# Patient Record
Sex: Male | Born: 1980 | Race: Black or African American | Hispanic: No | Marital: Single | Smoking: Never smoker
Health system: Southern US, Community
[De-identification: ages and names within clinical notes are randomized; demographics above are authoritative.]

## PROBLEM LIST (undated history)

## (undated) DIAGNOSIS — I10 Essential (primary) hypertension: Secondary | ICD-10-CM

---

## 2010-07-22 HISTORY — PX: TENDON REPAIR: SHX5111

## 2020-10-25 NOTE — Progress Notes (Signed)
 [place] Department of Health notified of results per regulations

## 2020-10-25 NOTE — Progress Notes (Signed)
 Patient denies shortness of breath, headache, and chest pain.  States he did not take his medication.  Provider notified and patient is waiting in waiting room.

## 2020-12-15 ENCOUNTER — Other Ambulatory Visit: Payer: Self-pay

## 2020-12-15 ENCOUNTER — Emergency Department (HOSPITAL_COMMUNITY): Payer: Self-pay

## 2020-12-15 ENCOUNTER — Emergency Department (HOSPITAL_COMMUNITY)
Admission: EM | Admit: 2020-12-15 | Discharge: 2020-12-15 | Disposition: A | Discharge status: Home / Self Care | Payer: Self-pay | Attending: Emergency Medicine | Admitting: Emergency Medicine

## 2020-12-15 ENCOUNTER — Encounter (HOSPITAL_COMMUNITY): Payer: Self-pay

## 2020-12-15 DIAGNOSIS — R519 Headache, unspecified: Secondary | ICD-10-CM

## 2020-12-15 DIAGNOSIS — Z79899 Other long term (current) drug therapy: Secondary | ICD-10-CM | POA: Insufficient documentation

## 2020-12-15 DIAGNOSIS — I10 Essential (primary) hypertension: Secondary | ICD-10-CM | POA: Insufficient documentation

## 2020-12-15 HISTORY — DX: Essential (primary) hypertension: I10

## 2020-12-15 MED ORDER — METOCLOPRAMIDE HCL 5 MG/ML IJ SOLN
10.0000 mg | Freq: Once | INTRAMUSCULAR | Status: AC
  Administered 2020-12-15: 10 mg via INTRAVENOUS
  Filled 2020-12-15: qty 2

## 2020-12-15 MED ORDER — LISINOPRIL 20 MG PO TABS
20.0000 mg | ORAL_TABLET | Freq: Once | ORAL | Status: AC
  Administered 2020-12-15: 20 mg via ORAL
  Filled 2020-12-15: qty 1

## 2020-12-15 MED ORDER — LISINOPRIL 20 MG PO TABS: 20.0000 mg | ORAL_TABLET | Freq: Every day | ORAL | 0 refills | Status: DC

## 2020-12-15 MED ORDER — DIPHENHYDRAMINE HCL 50 MG/ML IJ SOLN
25.0000 mg | Freq: Once | INTRAMUSCULAR | Status: AC
  Administered 2020-12-15: 25 mg via INTRAVENOUS
  Filled 2020-12-15: qty 1

## 2020-12-15 NOTE — ED Triage Notes (Signed)
Patient arrives via EMS from home due to a sudden onset of n/v/headache x1 hour. Patient is currently out of his bp med (lisinopril) and had the same symptoms when he ran out of hiss bp meds before.   bp 200/110 en route, decreased to 175/120. Given 4mg  IV zofran

## 2020-12-15 NOTE — Discharge Instructions (Signed)
Please read and follow all provided instructions.  Your diagnoses today include:  1. Acute nonintractable headache, unspecified headache type   2. Primary hypertension     Tests performed today include:  CT of your head which was normal and did not show any serious cause of your headache  Vital signs. See below for your results today.   Medications:  In the Emergency Department you received:  Reglan - antinausea/headache medication  Benadryl - antihistamine to counteract potential side effects of reglan   Lisinopril - medication for blood pressure  Take any prescribed medications only as directed.  Additional information:  Follow any educational materials contained in this packet.  You are having a headache. No specific cause was found today for your headache. It may have been a migraine or other cause of headache. Stress, anxiety, fatigue, and depression are common triggers for headaches.   Your headache today does not appear to be life-threatening or require hospitalization, but often the exact cause of headaches is not determined in the emergency department. Therefore, follow-up with your doctor is very important to find out what may have caused your headache and whether or not you need any further diagnostic testing or treatment.   Sometimes headaches can appear benign (not harmful), but then more serious symptoms can develop which should prompt an immediate re-evaluation by your doctor or the emergency department.  BE VERY CAREFUL not to take multiple medicines containing Tylenol (also called acetaminophen). Doing so can lead to an overdose which can damage your liver and cause liver failure and possibly death.   Follow-up instructions: Please follow-up with your primary care provider in the next 3 days for further evaluation of your symptoms.   Return instructions:   Please return to the Emergency Department if you experience worsening symptoms.  Return if the  medications do not resolve your headache, if it recurs, or if you have multiple episodes of vomiting or cannot keep down fluids.  Return if you have a change from the usual headache.  RETURN IMMEDIATELY IF you:  Develop a sudden, severe headache  Develop confusion or become poorly responsive or faint  Develop a fever above 100.51F or problem breathing  Have a change in speech, vision, swallowing, or understanding  Develop new weakness, numbness, tingling, incoordination in your arms or legs  Have a seizure  Please return if you have any other emergent concerns.  Additional Information:  Your vital signs today were: BP (!) 163/108 (BP Location: Right Arm)   Pulse 67   Temp 97.9 F (36.6 C) (Oral)   Resp 20   Ht 6' (1.829 m)   Wt 115.7 kg   SpO2 99%   BMI 34.58 kg/m  If your blood pressure (BP) was elevated above 135/85 this visit, please have this repeated by your doctor within one month. --------------

## 2020-12-15 NOTE — ED Notes (Signed)
Patient transported to CT 

## 2020-12-15 NOTE — ED Notes (Signed)
Pt states that he has been out of his BP medications x 1 week.

## 2020-12-15 NOTE — ED Provider Notes (Signed)
[facility] EMERGENCY DEPARTMENT Provider Note   CSN: [id number] Arrival date & time: 12/15/20  4401     History Chief Complaint  Patient presents with  . Headache  . Hypertension    Terry Burke is a 40 y.o. male.  Patient with history of hypertension on lisinopril presents to the emergency department for evaluation of headache.  Patient states that sometimes he will develop "migraines" when he does not take his blood pressure medication.  He states that he has been out of lisinopril 20 mg for about a week because of difficulty with getting refills.  He states that he was feeling normal when he went to bed last night but awoke from sleep at around 4:15 AM with a severe frontal headache.  He had associated nausea and vomiting.  Significant other stated that he was "out of it" and had a little difficulty with his balance.  EMS was called for transport and administered Zofran.  Patient states that he is currently feeling a bit better. Patient denies signs of stroke including: facial droop, slurred speech, aphasia, weakness/numbness in extremities, imbalance/trouble walking.  No neck pain or pain with movement of the neck.  He has had a similar headache in the past but states that this is pretty severe.  EMS blood pressure 200/110.  Last BP here was 176/128. The onset of this condition was acute. The course is constant. Aggravating factors: light. Alleviating factors: none.  No recent trauma reported.         Past Medical History:  Diagnosis Date  . Hypertension     There are no problems to display for this patient.   Past Surgical History:  Procedure Laterality Date  . TENDON REPAIR Right 2012   right hand       History reviewed. No pertinent family history.  Social History   Tobacco Use  . Smoking status: Never Smoker  . Smokeless tobacco: Never Used  Vaping Use  . Vaping Use: Some days  . Devices: uses hooka socially  Substance Use Topics  .  Alcohol use: Yes    Comment: social drinker  . Drug use: Never    Home Medications Prior to Admission medications   Medication Sig Start Date End Date Taking? Authorizing Provider  Glycerin-Hypromellose-PEG 400 (VISINE DRY EYE OP) Place 1 drop into both eyes daily as needed (dry eyes).   Yes [provider]  ibuprofen (ADVIL) 200 MG tablet Take 800 mg by mouth every 6 (six) hours as needed for headache or moderate pain.   Yes [provider]  lisinopril (ZESTRIL) 20 MG tablet Take 20 mg by mouth daily.   Yes [provider]  multivitamin (ONE-A-DAY MEN'S) TABS tablet Take 1 tablet by mouth daily.   Yes [provider]    Allergies    Patient has no known allergies.  Review of Systems   Review of Systems  Constitutional: Negative for fever.  HENT: Negative for congestion, dental problem, rhinorrhea and sinus pressure.   Eyes: Negative for photophobia, discharge, redness and visual disturbance.  Respiratory: Negative for shortness of breath.   Cardiovascular: Negative for chest pain.  Gastrointestinal: Positive for nausea and vomiting.  Musculoskeletal: Positive for gait problem. Negative for neck pain and neck stiffness.  Skin: Negative for rash.  Neurological: Positive for headaches. Negative for syncope, speech difficulty, weakness, light-headedness and numbness.  Psychiatric/Behavioral: Positive for confusion.    Physical Exam Updated Vital Signs BP (!) 176/128 (BP Location: Right Arm)  Pulse 77   Temp 97.9 F (36.6 C) (Oral)   Resp 18   Ht 6' (1.829 m)   Wt 115.7 kg   SpO2 99%   BMI 34.58 kg/m   Physical Exam Vitals and nursing note reviewed.  Constitutional:      Appearance: He is well-developed.  HENT:     Head: Normocephalic and atraumatic.     Right Ear: Tympanic membrane, ear canal and external ear normal.     Left Ear: Tympanic membrane, ear canal and external ear normal.     Nose: Nose normal.     Mouth/Throat:      Pharynx: Uvula midline.  Eyes:     General: Lids are normal.     Conjunctiva/sclera: Conjunctivae normal.     Pupils: Pupils are equal, round, and reactive to light.  Neck:     Comments: Full range of motion of neck without any discomfort or difficulty. Cardiovascular:     Rate and Rhythm: Normal rate and regular rhythm.  Pulmonary:     Effort: Pulmonary effort is normal.     Breath sounds: Normal breath sounds.  Abdominal:     Palpations: Abdomen is soft.     Tenderness: There is no abdominal tenderness.  Musculoskeletal:        General: Normal range of motion.     Cervical back: Normal range of motion and neck supple. No tenderness or bony tenderness.  Skin:    General: Skin is warm and dry.  Neurological:     Mental Status: He is alert and oriented to person, place, and time.     GCS: GCS eye subscore is 4. GCS verbal subscore is 5. GCS motor subscore is 6.     Cranial Nerves: No cranial nerve deficit.     Sensory: No sensory deficit.     Motor: No abnormal muscle tone.     Coordination: Coordination normal.     Gait: Gait normal.     Deep Tendon Reflexes: Reflexes are normal and symmetric.     ED Results / Procedures / Treatments   Labs (all labs ordered are listed, but only abnormal results are displayed) Labs Reviewed - No data to display  EKG None  Radiology CT Head Wo Contrast  Result Date: 12/15/2020 CLINICAL DATA:  Headache with intracranial hemorrhage suspected. Elevated blood pressure. EXAM: CT HEAD WITHOUT CONTRAST TECHNIQUE: Contiguous axial images were obtained from the base of the skull through the vertex without intravenous contrast. COMPARISON:  None. FINDINGS: Brain: No evidence of acute infarction, hemorrhage, hydrocephalus, extra-axial collection or mass lesion/mass effect. Vascular: No hyperdense vessel or unexpected calcification. Skull: Normal. Negative for fracture or focal lesion. Sinuses/Orbits: Generalized moderate mucosal thickening in the  paranasal sinuses. IMPRESSION: 1. No acute finding.  Negative for intracranial hemorrhage. 2. Generalized mucosal thickening in the paranasal sinuses. Electronically Signed   By: Marnee Spring M.D.   On: 12/15/2020 07:52    Procedures Procedures   Medications Ordered in ED Medications  metoCLOPramide (REGLAN) injection 10 mg (10 mg Intravenous Given 12/15/20 0755)  diphenhydrAMINE (BENADRYL) injection 25 mg (25 mg Intravenous Given 12/15/20 0755)  lisinopril (ZESTRIL) tablet 20 mg (20 mg Oral Given 12/15/20 0755)    ED Course  I have reviewed the triage vital signs and the nursing notes.  Pertinent labs & imaging results that were available during my care of the patient were reviewed by me and considered in my medical decision making (see chart for details).  Patient seen and examined.  On my exam here, he looks good with a normal neuro exam.  He does have some warning signs including awaking from sleep with severe headache, headache that is somewhat atypical from normal especially in severity.  He may have had some confusion or difficulty with balance earlier however this appears to be resolved now.  It has been 3 hours since onset of symptoms.  We will treat headache pain with migraine cocktail.  Will give a dose of oral lisinopril.  We discussed utility of head imaging and patient agrees to proceed given red flag symptoms.  Vital signs reviewed and are as follows: BP (!) 176/128 (BP Location: Right Arm)   Pulse 77   Temp 97.9 F (36.6 C) (Oral)   Resp 18   Ht 6' (1.829 m)   Wt 115.7 kg   SpO2 99%   BMI 34.58 kg/m   7:52 AM CT reviewed. Appears normal. Awaiting read.   9:10 AM patient rechecked.  Updated on reassuring CT results.  Blood pressure is normalizing now that his headache is better and he has received blood pressure medication.  Patient is comfortable with discharged home at this time.  He does request information to establish care with a PCP.  Home with lisinopril 20  mg #15.  Patient counseled to return if they have weakness in their arms or legs, slurred speech, trouble walking or talking, confusion, trouble with their balance, or if they have any other concerns. Patient verbalizes understanding and agrees with plan.     MDM Rules/Calculators/A&P                          HA: Patient with headache in setting of elevated blood pressures.  He is out of medication.  Headache is somewhat atypical but not unprecedented for him.  Pain did wake him up from sleep.  Patient without other high-risk features of headache including:  altered mental status, accompanying seizure, headache with exertion, age > 49, history of immunocompromise, neck or shoulder pain, fever, use of anticoagulation, family history of spontaneous SAH, concomitant drug use, toxic exposure.   Patient has a normal complete neurological exam, normal vital signs, normal level of consciousness, no signs of meningismus, is well-appearing/non-toxic appearing, no signs of trauma.  CT imaging performed within 4 hours and did not show any signs of subarachnoid hemorrhage or other bleeding.  No dangerous or life-threatening conditions suspected or identified by history, physical exam, and by work-up. No indications for hospitalization identified.   Hypertension: Currently uncontrolled.  Will restart lisinopril per his previous regimen.  Blood pressure improving with p.o. meds and pain control here.   Final Clinical Impression(s) / ED Diagnoses Final diagnoses:  Acute nonintractable headache, unspecified headache type  Primary hypertension    Rx / DC Orders ED Discharge Orders         Ordered    lisinopril (ZESTRIL) 20 MG tablet  Daily        12/15/20 0908           Renne Crigler, PA-C 12/15/20 0912    Long, Arlyss Repress, MD 12/16/20 [phone]

## 2022-05-14 IMAGING — CT CT HEAD W/O CM
4 series · 16 of 47 positions shown, 18 images · non-contrast
Comparison: None.

CLINICAL DATA: Headache with intracranial hemorrhage suspected.
Elevated blood pressure.

EXAM:
CT HEAD WITHOUT CONTRAST
TECHNIQUE: Contiguous axial images were obtained from the base of the skull
through the vertex without intravenous contrast.

[Series 3: head wo · axial · 0.47mm/px · z∈[-142,-22]mm · 7 of 32 slices shown, 9 images]
[im 4/32  brain]
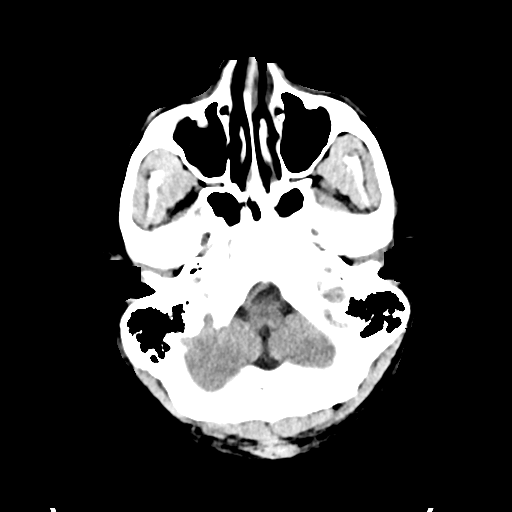
[im 4/32  bone]
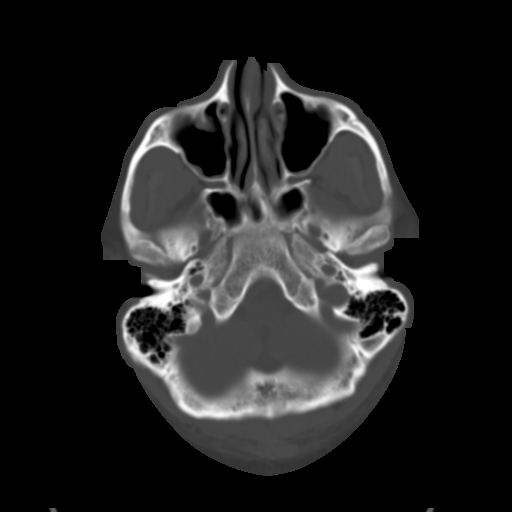
[im 8/32  brain]
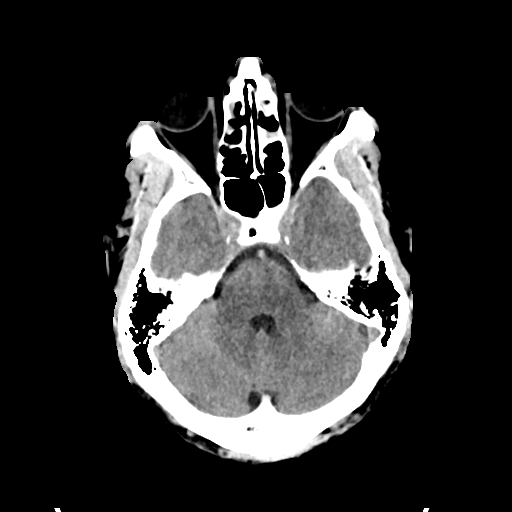
[im 12/32  brain]
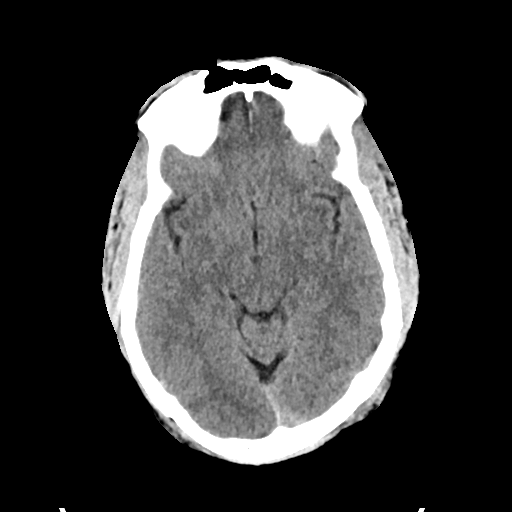
[im 16/32  brain]
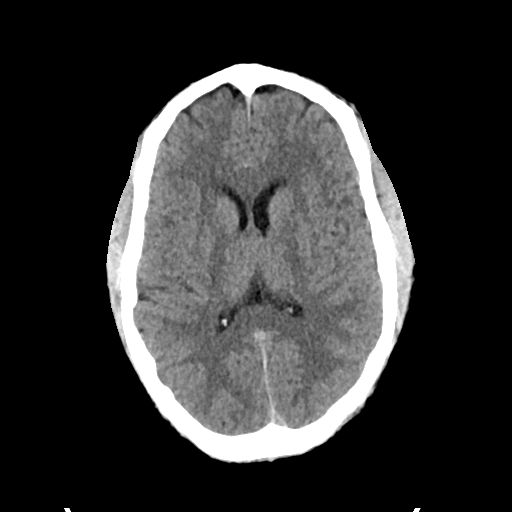
[im 20/32  brain]
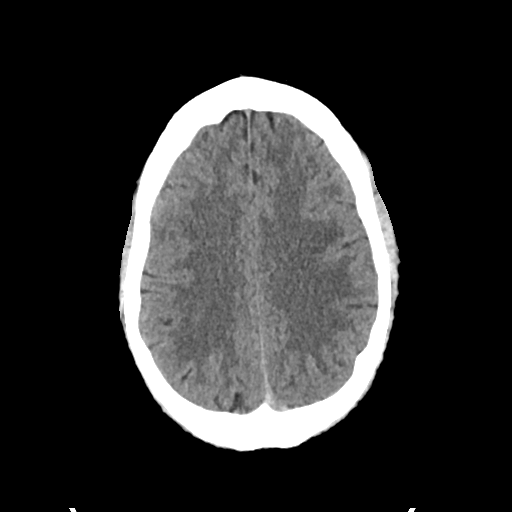
[im 20/32  bone]
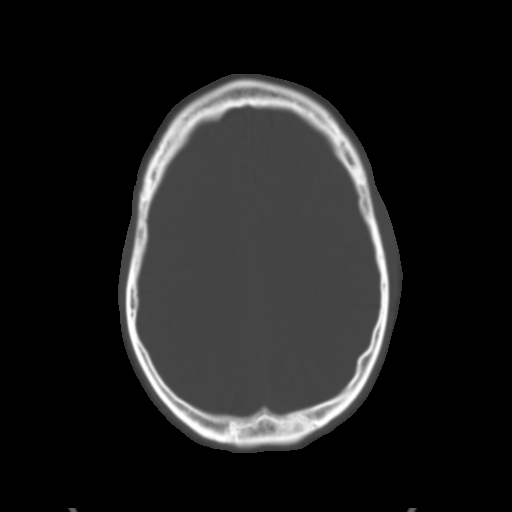
[im 24/32  brain]
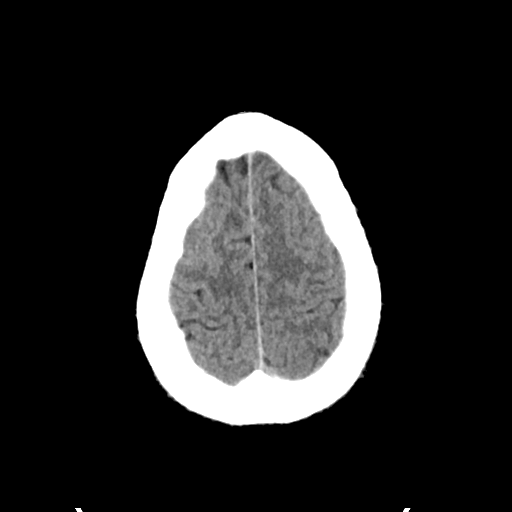
[im 28/32  brain]
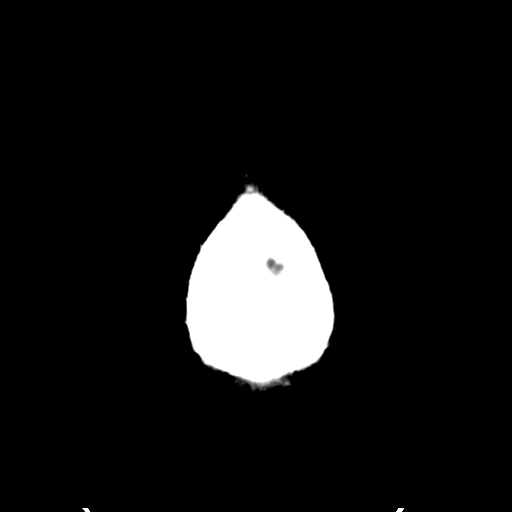

[Series 4: head bone · axial · 0.47mm/px · z∈[-144,-112]mm · 3 of 79 slices shown]
[im 8/79  bone]
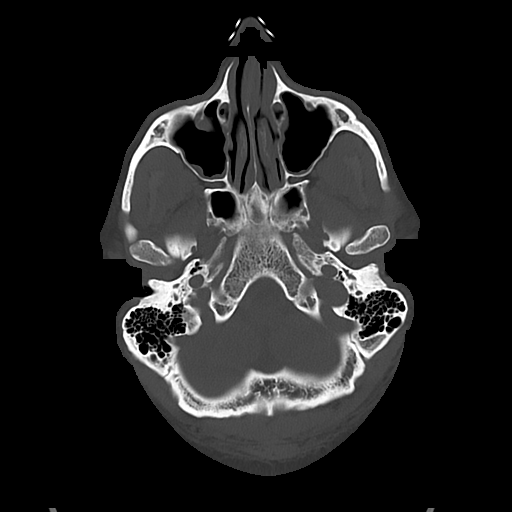
[im 16/79  bone]
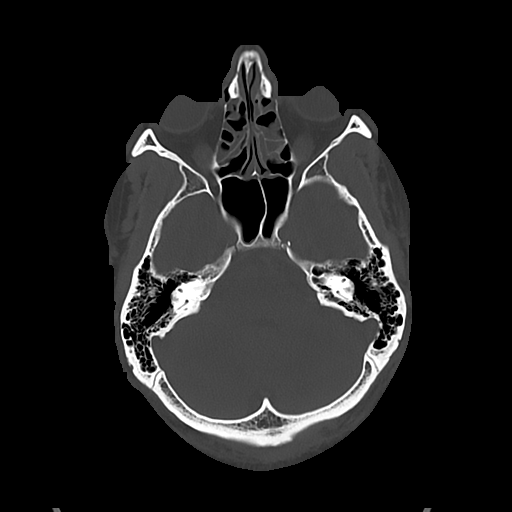
[im 24/79  bone]
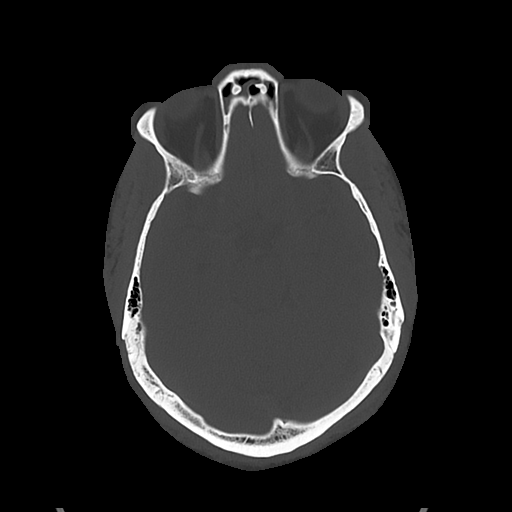

[Series 5: cor soft · coronal · 0.32mm/px · 3 of 77 slices shown]
[im 26/77  brain]
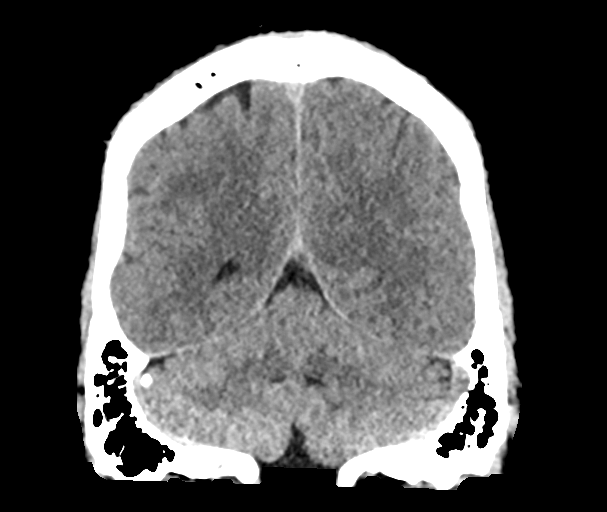
[im 34/77  brain]
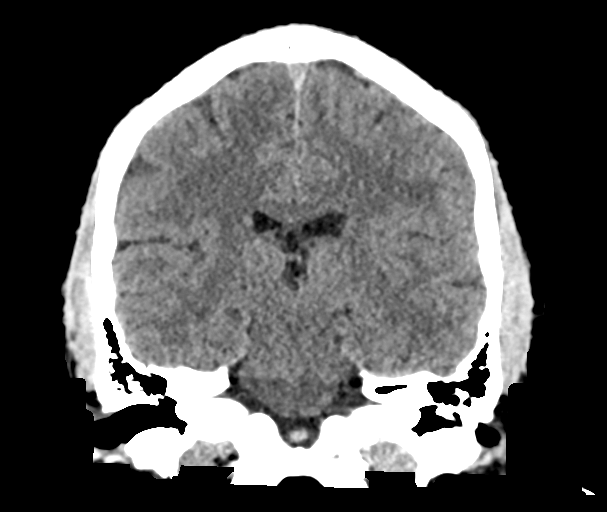
[im 43/77  brain]
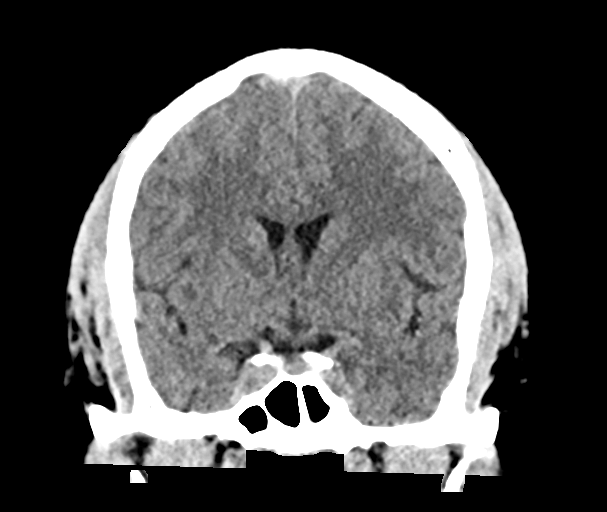

[Series 6: sag soft · sagittal · 0.34mm/px · 3 of 65 slices shown]
[im 22/65  brain]
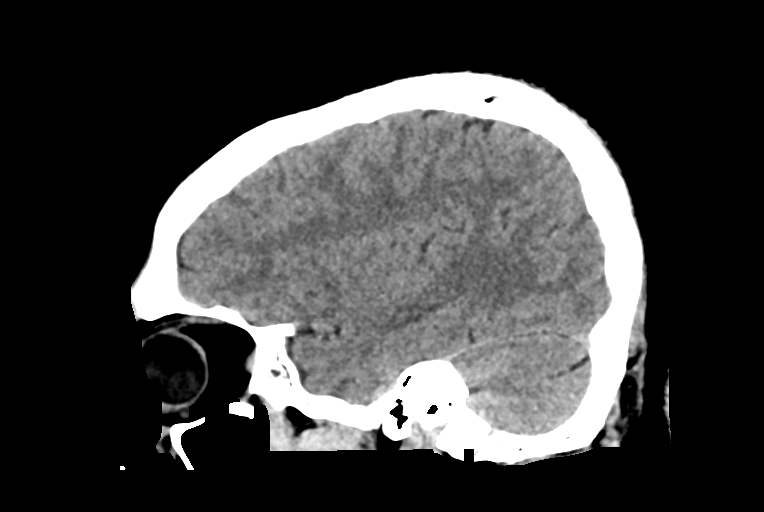
[im 33/65  brain]
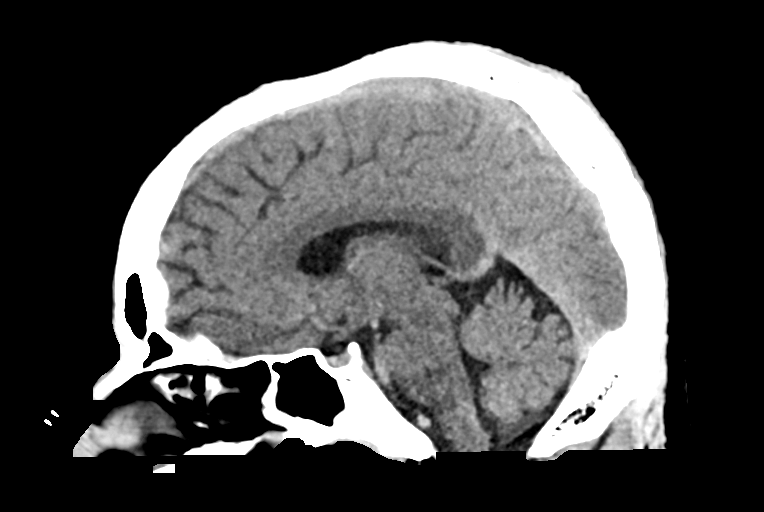
[im 43/65  brain]
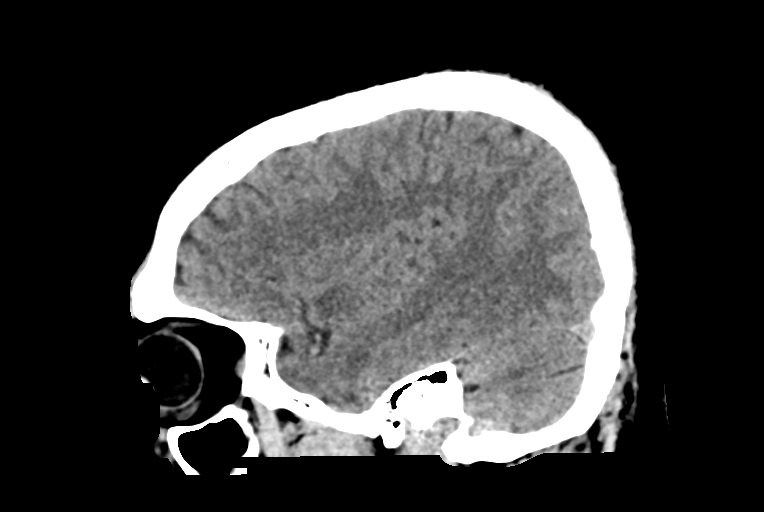

[16 of 47 positions shown; findings below may reference images not displayed]

FINDINGS: Brain: No evidence of acute infarction, hemorrhage, hydrocephalus,
extra-axial collection or mass lesion/mass effect.

Vascular: No hyperdense vessel or unexpected calcification.

Skull: Normal. Negative for fracture or focal lesion.

Sinuses/Orbits: Generalized moderate mucosal thickening in the
paranasal sinuses.
IMPRESSION: 1. No acute finding.  Negative for intracranial hemorrhage.
2. Generalized mucosal thickening in the paranasal sinuses.

## 2022-10-02 ENCOUNTER — Emergency Department (HOSPITAL_COMMUNITY)
Admission: EM | Admit: 2022-10-02 | Discharge: 2022-10-02 | Disposition: A | Discharge status: Home / Self Care | Payer: Managed Care, Other (non HMO) | Attending: Emergency Medicine | Admitting: Emergency Medicine

## 2022-10-02 ENCOUNTER — Encounter (HOSPITAL_COMMUNITY): Payer: Self-pay | Admitting: Emergency Medicine

## 2022-10-02 ENCOUNTER — Other Ambulatory Visit: Payer: Self-pay

## 2022-10-02 DIAGNOSIS — J029 Acute pharyngitis, unspecified: Secondary | ICD-10-CM | POA: Diagnosis not present

## 2022-10-02 DIAGNOSIS — E860 Dehydration: Secondary | ICD-10-CM | POA: Insufficient documentation

## 2022-10-02 DIAGNOSIS — R5381 Other malaise: Secondary | ICD-10-CM | POA: Insufficient documentation

## 2022-10-02 DIAGNOSIS — I10 Essential (primary) hypertension: Secondary | ICD-10-CM | POA: Diagnosis not present

## 2022-10-02 DIAGNOSIS — R42 Dizziness and giddiness: Secondary | ICD-10-CM | POA: Insufficient documentation

## 2022-10-02 DIAGNOSIS — Z79899 Other long term (current) drug therapy: Secondary | ICD-10-CM | POA: Diagnosis not present

## 2022-10-02 DIAGNOSIS — R55 Syncope and collapse: Secondary | ICD-10-CM | POA: Diagnosis not present

## 2022-10-02 DIAGNOSIS — R531 Weakness: Secondary | ICD-10-CM | POA: Diagnosis not present

## 2022-10-02 NOTE — Discharge Instructions (Addendum)
We evaluated you for your near fainting episode.  Your symptoms are likely due to a combination of donating plasma, as well as tobacco from the hookah, dehydration, influenza, and alcohol.  These conditions can contribute to a condition called orthostatic near syncope, which is a type of fainting that can occur when standing.  Your EKG did not show any signs of a dangerous abnormal rhythm.  Please be sure to drink lots of fluids.  Please follow-up closely with your primary physician.  Please be careful when standing up over the next couple days to avoid any recurrent episodes.  If you develop any episodes that occur while you are sitting down or not engaged in activity, or you have any episodes of loss of consciousness, please return to the emergency department.

## 2022-10-02 NOTE — ED Triage Notes (Signed)
Pt BIB EMS from home with c/o dizziness and hypotensive. Has been sick with the flu recently and donated plasma today. Per ems pt went home, ate dinner, drank a  glass of wine and smoked hookah. Pt got dizzy when he stood up.  BP 104/70 88 HR 97RA  Cbg 132  20 left hand, 250cc NS

## 2022-10-02 NOTE — ED Provider Notes (Signed)
[facility] Provider Note  CSN: [id number] Arrival date & time: 10/02/22 2027  Chief Complaint(s) Dizziness  HPI Terry Burke is a 42 y.o. male presenting to the emergency department with near syncope.  The patient reports that today he was at home, decided to smoke hookah.  After smoking hookah he stood up and felt lightheaded and had a near syncopal episode.  He reports that he did not fall hard to the ground or strike his head.  He was out for around a minute or so.  His wife called paramedics.  He reports that he had also drank alcohol before this happened.  Earlier today he donated plasma.  He was also recently told he had the flu over the weekend and has had some generalized weakness and malaise with bodyaches, sore throat, runny nose.  He reports that he did not drink much water today.  He reports he currently feels better.  He received some fluids with EMS.  No nausea or vomiting.  No headaches.  No seizure activity.  No chest pain.  No shortness of breath.  No abdominal pain.   Past Medical History Past Medical History:  Diagnosis Date   Hypertension    There are no problems to display for this patient.  Home Medication(s) Prior to Admission medications   Medication Sig Start Date End Date Taking? Authorizing Provider  Glycerin-Hypromellose-PEG 400 (VISINE DRY EYE OP) Place 1 drop into both eyes daily as needed (dry eyes).    [provider]  ibuprofen (ADVIL) 200 MG tablet Take 800 mg by mouth every 6 (six) hours as needed for headache or moderate pain.    [provider]  lisinopril (ZESTRIL) 20 MG tablet Take 1 tablet (20 mg total) by mouth daily. 12/15/20   Carlisle Cater, PA-C  multivitamin (ONE-A-DAY MEN'S) TABS tablet Take 1 tablet by mouth daily.    [provider]                                                                                                                                    Past  Surgical History Past Surgical History:  Procedure Laterality Date   TENDON REPAIR Right 2012   right hand   Family History History reviewed. No pertinent family history.  Social History Social History   Tobacco Use   Smoking status: Never   Smokeless tobacco: Never  Vaping Use   Vaping Use: Some days   Devices: uses hooka socially  Substance Use Topics   Alcohol use: Yes    Comment: social drinker   Drug use: Never   Allergies Patient has no known allergies.  Review of Systems Review of Systems  All other systems reviewed and are negative.   Physical Exam Vital Signs  I have reviewed the triage vital signs BP 116/79 (BP Location: Left Arm)   Pulse 85   Temp 98.1 F (36.7  C) (Oral)   Resp 16   Ht 6' (1.829 m)   Wt 115 kg   SpO2 97%   BMI 34.38 kg/m  Physical Exam Vitals and nursing note reviewed.  Constitutional:      General: He is not in acute distress.    Appearance: Normal appearance.  HENT:     Mouth/Throat:     Mouth: Mucous membranes are moist.  Eyes:     Conjunctiva/sclera: Conjunctivae normal.  Cardiovascular:     Rate and Rhythm: Normal rate and regular rhythm.  Pulmonary:     Effort: Pulmonary effort is normal. No respiratory distress.     Breath sounds: Normal breath sounds.  Abdominal:     General: Abdomen is flat.     Palpations: Abdomen is soft.     Tenderness: There is no abdominal tenderness.  Musculoskeletal:     Right lower leg: No edema.     Left lower leg: No edema.  Skin:    General: Skin is warm and dry.     Capillary Refill: Capillary refill takes less than 2 seconds.  Neurological:     Mental Status: He is alert and oriented to person, place, and time. Mental status is at baseline.  Psychiatric:        Mood and Affect: Mood normal.        Behavior: Behavior normal.     ED Results and Treatments Labs (all labs ordered are listed, but only abnormal results are displayed) Labs Reviewed - No data to display                                                                                                                         Radiology No results found.  Pertinent labs & imaging results that were available during my care of the patient were reviewed by me and considered in my medical decision making (see MDM for details).  Medications Ordered in ED Medications - No data to display                                                                                                                                   Procedures Procedures  (including critical care time)  Medical Decision Making / ED Course   MDM:  42 year old male presenting to the emergency department with near syncope.  Patient well-appearing, highly suspect orthostatic syncope.  Occurred after standing.  He had multiple  reasons also to have orthostatic syncope with decreased fluid intake today, he donated plasma today, he drank alcohol today, and he was smoking hookah, he also was diagnosed with the flu recently.  He feels much better after receiving fluids.  He is EKG is reassuring without evidence of proarrhythmic condition.  Very low concern for cardiogenic syncope.  Also less likely vasovagal syncope without clear vasovagal trigger.  Doubt CNS cause of near syncope.  Given patient feels back to baseline, has multiple reasons to have orthostatic syncope and his history is consistent with orthostatic syncope, and he feels much better after receiving IV fluids, will discharge patient. CBG 132.  Recommended close follow-up with his primary physician. Will discharge patient to home. All questions answered. Patient comfortable with plan of discharge. Return precautions discussed with patient and specified on the after visit summary.       Additional history obtained: -Additional history obtained from ems   EKG   EKG Interpretation  Date/Time:  Wednesday October 02 2022 20:45:39 EDT Ventricular Rate:  81 PR Interval:  136 QRS  Duration: 96 QT Interval:  362 QTC Calculation: 420 R Axis:   198 Text Interpretation: Normal sinus rhythm Right axis deviation Confirmed by Garnette Gunner [phone]) on 10/02/2022 8:50:57 PM        Medicines ordered and prescription drug management: No orders of the defined types were placed in this encounter.   -I have reviewed the patients home medicines and have made adjustments as needed    Cardiac Monitoring: The patient was maintained on a cardiac monitor.  I personally viewed and interpreted the cardiac monitored which showed an underlying rhythm of: NSR  Social Determinants of Health:  Diagnosis or treatment significantly limited by social determinants of health: obesity   Reevaluation: After the interventions noted above, I reevaluated the patient and found that their symptoms have resolved  Co morbidities that complicate the patient evaluation  Past Medical History:  Diagnosis Date   Hypertension       Dispostion: Disposition decision including need for hospitalization was considered, and patient discharged from emergency department.    Final Clinical Impression(s) / ED Diagnoses Final diagnoses:  Dehydration  Postural dizziness with near syncope     This chart was dictated using voice recognition software.  Despite best efforts to proofread,  errors can occur which can change the documentation meaning.    Cristie Hem, MD 10/02/22 [phone]

## 2024-06-22 ENCOUNTER — Observation Stay (HOSPITAL_COMMUNITY)
Admission: EM | Admit: 2024-06-22 | Discharge: 2024-06-22 | Disposition: A | Discharge status: Home / Self Care | Attending: Emergency Medicine | Admitting: Emergency Medicine

## 2024-06-22 ENCOUNTER — Encounter (HOSPITAL_COMMUNITY): Payer: Self-pay

## 2024-06-22 ENCOUNTER — Emergency Department (HOSPITAL_COMMUNITY)

## 2024-06-22 ENCOUNTER — Other Ambulatory Visit: Payer: Self-pay

## 2024-06-22 ENCOUNTER — Observation Stay (HOSPITAL_COMMUNITY)

## 2024-06-22 DIAGNOSIS — I10 Essential (primary) hypertension: Secondary | ICD-10-CM

## 2024-06-22 DIAGNOSIS — R079 Chest pain, unspecified: Secondary | ICD-10-CM | POA: Diagnosis not present

## 2024-06-22 LAB — I-STAT CHEM 8, ED
BUN: 10 mg/dL (ref 6–20)
Calcium, Ion: 1.24 mmol/L (ref 1.15–1.40)
Chloride: 100 mmol/L (ref 98–111)
Creatinine, Ser: 1 mg/dL (ref 0.61–1.24)
Glucose, Bld: 98 mg/dL (ref 70–99)
HCT: 53 % — ABNORMAL HIGH (ref 39.0–52.0)
Hemoglobin: 18 g/dL — ABNORMAL HIGH (ref 13.0–17.0)
Potassium: 3.7 mmol/L (ref 3.5–5.1)
Sodium: 139 mmol/L (ref 135–145)
TCO2: 25 mmol/L (ref 22–32)

## 2024-06-22 LAB — TROPONIN I (HIGH SENSITIVITY): Troponin I (High Sensitivity): 7 ng/L (ref <18)

## 2024-06-22 LAB — I-STAT CG4 LACTIC ACID, ED
Lactic Acid, Venous: 1.6 mmol/L (ref 0.5–1.9)
Lactic Acid, Venous: 1.3 mmol/L (ref 0.5–1.9)

## 2024-06-22 MED ORDER — MORPHINE SULFATE (PF) 2 MG/ML IV SOLN
2.0000 mg | Freq: Once | INTRAVENOUS | Status: AC
  Administered 2024-06-22: 2 mg via INTRAVENOUS
  Filled 2024-06-22: qty 1

## 2024-06-22 MED ORDER — ASPIRIN 81 MG PO CHEW
324.0000 mg | CHEWABLE_TABLET | Freq: Once | ORAL | Status: AC
  Administered 2024-06-22: 324 mg via ORAL
  Filled 2024-06-22: qty 4

## 2024-06-22 NOTE — ED Triage Notes (Signed)
 Patient arrives POV c/o chest pain starting around 1pm, patient moved to triage for EKG and room for eval.

## 2024-06-22 NOTE — ED Notes (Signed)
 Pt denies any CP and would like to leave and follow up outpatient. This clinical research associate spoke with Cardiology who stated that was reasonable. Will prepare pt for discharge.

## 2024-06-22 NOTE — H&P (Signed)
 Cardiology Admission History and Physical   Patient ID: Terry Burke MRN: [id number]; DOB: 1981/04/27   Admission date: 06/22/2024  PCP:  Pcp, No   [facility] Providers Cardiologist:  None       Chief Complaint: Chest pain  Patient Profile: Terry Burke is a 43 y.o. male with hypertension who is being seen 06/22/2024 for the evaluation of chest pain.  History of Present Illness: Terry Burke is a 43 year old male with a history of hypertension who presents with acute onset chest pain.  The patient was in his normal state of health up until earlier today when he developed sharp central chest pain.  Initially it was around a 4 out of 10 in severity with no radiation.  Was not associate with shortness of breath.  Over the next hour or so the patient tells me the chest pain did get worse.  He noticed that it was made better when he rubbed on his chest.  He was concerned and he called EMS.  An EKG which I reviewed demonstrated normal sinus rhythm with mild J-point elevation in lead II only.  Currently the patient is chest pain-free and hemodynamically stable.  He did undergo right knee surgery earlier this year.  He has been ambulating without issues.  He denies any significant unilateral peripheral edema.  He denies a history of diabetes, stroke, or drug use.  He does not smoke.  He is currently on disability.   Past Medical History:  Diagnosis Date   Hypertension    Past Surgical History:  Procedure Laterality Date   TENDON REPAIR Right 2012   right hand     Medications Prior to Admission: Prior to Admission medications   Medication Sig Start Date End Date Taking? Authorizing Provider  Glycerin-Hypromellose-PEG 400 (VISINE DRY EYE OP) Place 1 drop into both eyes daily as needed (dry eyes).    [provider]  ibuprofen (ADVIL) 200 MG tablet Take 800 mg by mouth every 6 (six) hours as needed for headache or moderate pain.    [provider]  lisinopril   (ZESTRIL ) 20 MG tablet Take 1 tablet (20 mg total) by mouth daily. 12/15/20   Geiple, Joshua, PA-C  multivitamin (ONE-A-DAY MEN'S) TABS tablet Take 1 tablet by mouth daily.    [provider]     Allergies:   No Known Allergies  Social History:   Social History   Socioeconomic History   Marital status: Single    Spouse name: Not on file   Number of children: Not on file   Years of education: Not on file   Highest education level: Not on file  Occupational History   Not on file  Tobacco Use   Smoking status: Never   Smokeless tobacco: Never  Vaping Use   Vaping status: Some Days   Devices: uses hooka socially  Substance and Sexual Activity   Alcohol use: Yes    Comment: social drinker   Drug use: Never   Sexual activity: Not on file  Other Topics Concern   Not on file  Social History Narrative   Not on file   Social Drivers of Health   Financial Resource Strain: Not on file  Food Insecurity: Not on file  Transportation Needs: Not on file  Physical Activity: Not on file  Stress: Not on file  Social Connections: Not on file  Intimate Partner Violence: Not on file     Family History:   The patient's family history is not on file.  ROS:  Please see the history of present illness.  All other ROS reviewed and negative.     Physical Exam/Data: Vitals:   06/22/24 1356  BP: 117/86  Pulse: 93  Resp: (!) 26  SpO2: 95%   No intake or output data in the 24 hours ending 06/22/24 1511    10/02/2022    8:31 PM 12/15/2020    6:20 AM  Last 3 Weights  Weight (lbs) 253 lb 8.5 oz 255 lb  Weight (kg) 115 kg 115.667 kg     There is no height or weight on file to calculate BMI.  General:  Well nourished, well developed, in no acute distress HEENT: normal Neck: no JVD Vascular: No carotid bruits; Distal pulses 2+ bilaterally   Cardiac:  normal S1, S2; RRR; no murmur  Lungs:  clear to auscultation bilaterally, no wheezing, rhonchi or rales  Abd: soft,  nontender, no hepatomegaly  Ext: no edema Musculoskeletal:  No deformities, BUE and BLE strength normal and equal Skin: warm and dry  Neuro:  CNs 2-12 intact, no focal abnormalities noted Psych:  Normal affect   EKG:  The ECG that was done today was personally reviewed and demonstrates normal sinus rhythm  Relevant CV Studies: Pending  Laboratory Data: High Sensitivity Troponin:  No results for input(s): TROPONINIHS in the last 720 hours.    ChemistryNo results for input(s): NA, K, CL, CO2, GLUCOSE, BUN, CREATININE, CALCIUM, MG, GFRNONAA, GFRAA, ANIONGAP in the last 168 hours.  No results for input(s): PROT, ALBUMIN, AST, ALT, ALKPHOS, BILITOT in the last 168 hours. Lipids No results for input(s): CHOL, TRIG, HDL, LABVLDL, LDLCALC, CHOLHDL in the last 168 hours. HematologyNo results for input(s): WBC, RBC, HGB, HCT, MCV, MCH, MCHC, RDW, PLT in the last 168 hours. Thyroid No results for input(s): TSH, FREET4 in the last 168 hours. BNPNo results for input(s): BNP, PROBNP in the last 168 hours.  DDimer No results for input(s): DDIMER in the last 168 hours.  Radiology/Studies:  No results found.   Assessment and Plan: Chest pain of uncertain etiology: The patient is chest pain seems to be exacerbated by palpation.  This would suggest a musculoskeletal etiology.  For now we will trend troponins.  If his troponins are positive we will consider coronary angiography.  Will obtain echocardiogram to evaluate further.  His chest pain syndrome does not seem to be pleuritic in nature.  If troponins are positive we will start heparin infusion, aspirin, and high-dose atorvastatin. Hypertension: Plan on continuing home lisinopril  20 mg.  Risk Assessment/Risk Scores:   TIMI Risk Score for Unstable Angina or Non-ST Elevation MI:   The patient's TIMI risk score is 2, which indicates a 8% risk of all cause mortality, new or  recurrent myocardial infarction or need for urgent revascularization in the next 14 days.     Code Status: Full Code  Severity of Illness: The appropriate patient status for this patient is INPATIENT. Inpatient status is judged to be reasonable and necessary in order to provide the required intensity of service to ensure the patient's safety. The patient's presenting symptoms, physical exam findings, and initial radiographic and laboratory data in the context of their chronic comorbidities is felt to place them at high risk for further clinical deterioration. Furthermore, it is not anticipated that the patient will be medically stable for discharge from the hospital within 2 midnights of admission.   * I certify that at the point of admission it is my clinical judgment that the patient  will require inpatient hospital care spanning beyond 2 midnights from the point of admission due to high intensity of service, high risk for further deterioration and high frequency of surveillance required.*  For questions or updates, please contact [facility] Please consult [email or web address] for contact info under       Signed, Treena Cosman K Yola Paradiso, MD  06/22/2024 3:11 PM

## 2024-06-22 NOTE — ED Notes (Signed)
 Pt educated to return for any new or worsening CP or SHOB. Pt educated to follow up with his PCP and cardiology as appropriate. Pt verbalized understanding.

## 2024-06-22 NOTE — ED Provider Notes (Signed)
 [place] EMERGENCY DEPARTMENT AT [facility] Provider Note   CSN: [id number] Arrival date & time: 06/22/24  1352     Patient presents with: Chest Pain   Terry Burke is a 43 y.o. male.   43 year old male with prior medical history as detailed below presents for evaluation.  Patient reports acute onset of chest pain around 1:30 PM.  He reports substernal chest pressure and tightness.  Patient reports that pain has improved upon arrival to the ED.  Currently his pain is 2 out of 10.  He denies prior history of cardiac disease or chest pain.  He has a history of hypertension.  He takes losartan for treatment of hypertension per report.   EKG obtained in triage is concerning for possible acute ischemia.  Interventional cardiology made aware of case and have reviewed EKG.  They do not feel the patient is in acute STEMI at this time.  They request additional ED evaluation and cardiology will consult.  The history is provided by the patient and medical records.       Prior to Admission medications   Medication Sig Start Date End Date Taking? Authorizing Provider  Glycerin-Hypromellose-PEG 400 (VISINE DRY EYE OP) Place 1 drop into both eyes daily as needed (dry eyes).    [provider]  ibuprofen (ADVIL) 200 MG tablet Take 800 mg by mouth every 6 (six) hours as needed for headache or moderate pain.    [provider]  lisinopril  (ZESTRIL ) 20 MG tablet Take 1 tablet (20 mg total) by mouth daily. 12/15/20   Geiple, Joshua, PA-C  multivitamin (ONE-A-DAY MEN'S) TABS tablet Take 1 tablet by mouth daily.    [provider]    Allergies: Patient has no known allergies.    Review of Systems  All other systems reviewed and are negative.   Updated Vital Signs BP 117/86 (BP Location: Left Arm)   Pulse 93   Resp (!) 26   SpO2 95%   Physical Exam Vitals and nursing note reviewed.  Constitutional:      General: He is not in acute distress.     Appearance: He is well-developed.  HENT:     Head: Normocephalic and atraumatic.  Eyes:     Conjunctiva/sclera: Conjunctivae normal.  Cardiovascular:     Rate and Rhythm: Normal rate and regular rhythm.     Heart sounds: No murmur heard. Pulmonary:     Effort: Pulmonary effort is normal. No respiratory distress.     Breath sounds: Normal breath sounds.  Abdominal:     Palpations: Abdomen is soft.     Tenderness: There is no abdominal tenderness.  Musculoskeletal:        General: No swelling.     Cervical back: Neck supple.  Skin:    General: Skin is warm and dry.     Capillary Refill: Capillary refill takes less than 2 seconds.  Neurological:     Mental Status: He is alert.  Psychiatric:        Mood and Affect: Mood normal.     (all labs ordered are listed, but only abnormal results are displayed) Labs Reviewed  CBC WITH DIFFERENTIAL/PLATELET  BASIC METABOLIC PANEL WITH GFR  I-STAT CHEM 8, ED  I-STAT CG4 LACTIC ACID, ED  TROPONIN I (HIGH SENSITIVITY)    EKG: EKG Interpretation Date/Time:  Tuesday June 22 2024 14:02:34 EST Ventricular Rate:  90 PR Interval:  132 QRS Duration:  88 QT Interval:  352 QTC Calculation: 430 R Axis:  260  Text Interpretation: Normal sinus rhythm Right superior axis deviation Abnormal ECG When compared with ECG of 02-Oct-2022 20:45, PREVIOUS ECG IS PRESENT Confirmed by Laurice Coy [phone]) on 06/22/2024 2:36:52 PM  Radiology: No results found.   Procedures   Medications Ordered in the ED  aspirin chewable tablet 324 mg (has no administration in time range)  morphine (PF) 2 MG/ML injection 2 mg (has no administration in time range)                                    Medical Decision Making Patient presented with acute onset chest pain.  Initial EKG is concerning for possible early acute ischemia.  Interventional cardiology made aware of case.  They do not feel the patient meets criteria for acute intervention at this time.   Cardiology will consult for possible admission.  Screening labs pending.  Patient is feeling much improved at time of my evaluation (2/10 pain).  Amount and/or Complexity of Data Reviewed Labs: ordered. Radiology: ordered.  Risk OTC drugs. Prescription drug management. Decision regarding hospitalization.        Final diagnoses:  Chest pain, unspecified type    ED Discharge Orders     None          Laurice Coy BROCKS, MD 06/22/24 1520

## 2024-10-26 ENCOUNTER — Ambulatory Visit (HOSPITAL_COMMUNITY): Admit: 2024-10-26 | Admitting: Orthopedic Surgery

## 2024-10-26 SURGERY — ARTHROPLASTY, KNEE, UNICOMPARTMENTAL
Anesthesia: Spinal | Site: Knee | Laterality: Right
